# Patient Record
Sex: Male | Born: 1965 | Race: Black or African American | Hispanic: No | Smoking: Former smoker
Health system: Southern US, Community
[De-identification: ages and names within clinical notes are randomized; demographics above are authoritative.]

## PROBLEM LIST (undated history)

## (undated) DIAGNOSIS — S82891A Other fracture of right lower leg, initial encounter for closed fracture: Secondary | ICD-10-CM

---

## 2014-01-23 DIAGNOSIS — S82891A Other fracture of right lower leg, initial encounter for closed fracture: Secondary | ICD-10-CM

## 2014-01-23 HISTORY — DX: Other fracture of right lower leg, initial encounter for closed fracture: S82.891A

## 2015-10-26 ENCOUNTER — Emergency Department (HOSPITAL_COMMUNITY): Payer: 59

## 2015-10-26 ENCOUNTER — Inpatient Hospital Stay (HOSPITAL_COMMUNITY)
Admission: EM | Admit: 2015-10-26 | Discharge: 2015-10-28 | DRG: 563 | Disposition: A | Discharge status: Home / Self Care | Payer: 59 | Attending: General Surgery | Admitting: General Surgery

## 2015-10-26 ENCOUNTER — Encounter (HOSPITAL_COMMUNITY): Payer: Self-pay

## 2015-10-26 DIAGNOSIS — R0681 Apnea, not elsewhere classified: Secondary | ICD-10-CM | POA: Diagnosis present

## 2015-10-26 DIAGNOSIS — F10129 Alcohol abuse with intoxication, unspecified: Secondary | ICD-10-CM | POA: Diagnosis present

## 2015-10-26 DIAGNOSIS — M25571 Pain in right ankle and joints of right foot: Secondary | ICD-10-CM | POA: Diagnosis present

## 2015-10-26 DIAGNOSIS — S82841A Displaced bimalleolar fracture of right lower leg, initial encounter for closed fracture: Secondary | ICD-10-CM | POA: Diagnosis present

## 2015-10-26 DIAGNOSIS — R402432 Glasgow coma scale score 3-8, at arrival to emergency department: Secondary | ICD-10-CM | POA: Diagnosis present

## 2015-10-26 DIAGNOSIS — Y908 Blood alcohol level of 240 mg/100 ml or more: Secondary | ICD-10-CM | POA: Diagnosis present

## 2015-10-26 DIAGNOSIS — R4182 Altered mental status, unspecified: Secondary | ICD-10-CM

## 2015-10-26 DIAGNOSIS — Q899 Congenital malformation, unspecified: Secondary | ICD-10-CM

## 2015-10-26 DIAGNOSIS — S2242XA Multiple fractures of ribs, left side, initial encounter for closed fracture: Secondary | ICD-10-CM | POA: Diagnosis present

## 2015-10-26 DIAGNOSIS — F10929 Alcohol use, unspecified with intoxication, unspecified: Secondary | ICD-10-CM

## 2015-10-26 DIAGNOSIS — Y92410 Unspecified street and highway as the place of occurrence of the external cause: Secondary | ICD-10-CM | POA: Diagnosis not present

## 2015-10-26 DIAGNOSIS — S82891A Other fracture of right lower leg, initial encounter for closed fracture: Secondary | ICD-10-CM

## 2015-10-26 HISTORY — DX: Other fracture of right lower leg, initial encounter for closed fracture: S82.891A

## 2015-10-26 LAB — I-STAT CHEM 8, ED
BUN: 12 mg/dL (ref 6–20)
CALCIUM ION: 1.07 mmol/L — AB (ref 1.15–1.40)
CHLORIDE: 108 mmol/L (ref 101–111)
CREATININE: 1.5 mg/dL — AB (ref 0.61–1.24)
GLUCOSE: 97 mg/dL (ref 65–99)
HCT: 45 % (ref 39.0–52.0)
Hemoglobin: 15.3 g/dL (ref 13.0–17.0)
POTASSIUM: 3.5 mmol/L (ref 3.5–5.1)
Sodium: 144 mmol/L (ref 135–145)
TCO2: 22 mmol/L (ref 0–100)

## 2015-10-26 LAB — PREPARE FRESH FROZEN PLASMA
UNIT DIVISION: 0
Unit division: 0

## 2015-10-26 LAB — COMPREHENSIVE METABOLIC PANEL
ALK PHOS: 48 U/L (ref 38–126)
ALT: 18 U/L (ref 17–63)
AST: 26 U/L (ref 15–41)
Albumin: 3.8 g/dL (ref 3.5–5.0)
Anion gap: 10 (ref 5–15)
BILIRUBIN TOTAL: 0.5 mg/dL (ref 0.3–1.2)
BUN: 10 mg/dL (ref 6–20)
CHLORIDE: 109 mmol/L (ref 101–111)
CO2: 20 mmol/L — AB (ref 22–32)
CREATININE: 1.09 mg/dL (ref 0.61–1.24)
Calcium: 8.7 mg/dL — ABNORMAL LOW (ref 8.9–10.3)
GFR, EST AFRICAN AMERICAN: 46 mL/min — AB (ref >60)
GFR, EST NON AFRICAN AMERICAN: 40 mL/min — AB (ref >60)
GLUCOSE: 99 mg/dL (ref 65–99)
Potassium: 3.5 mmol/L (ref 3.5–5.1)
SODIUM: 139 mmol/L (ref 135–145)
Total Protein: 7.7 g/dL (ref 6.5–8.1)

## 2015-10-26 LAB — PROTIME-INR
INR: 1.23
Prothrombin Time: 15.5 seconds — ABNORMAL HIGH (ref 11.4–15.2)

## 2015-10-26 LAB — CBC
HCT: 43.1 % (ref 39.0–52.0)
Hemoglobin: 14.4 g/dL (ref 13.0–17.0)
MCH: 29.3 pg (ref 26.0–34.0)
MCHC: 33.4 g/dL (ref 30.0–36.0)
MCV: 87.6 fL (ref 78.0–100.0)
PLATELETS: 332 10*3/uL (ref 150–400)
RBC: 4.92 MIL/uL (ref 4.22–5.81)
RDW: 14.2 % (ref 11.5–15.5)
WBC: 10.9 10*3/uL — ABNORMAL HIGH (ref 4.0–10.5)

## 2015-10-26 LAB — I-STAT CG4 LACTIC ACID, ED: Lactic Acid, Venous: 1.88 mmol/L (ref 0.5–1.9)

## 2015-10-26 LAB — I-STAT TROPONIN, ED: Troponin i, poc: 0.01 ng/mL (ref 0.00–0.08)

## 2015-10-26 LAB — CDS SEROLOGY

## 2015-10-26 LAB — ETHANOL: Alcohol, Ethyl (B): 298 mg/dL — ABNORMAL HIGH (ref <5)

## 2015-10-26 MED ORDER — DEXTROSE-NACL 5-0.9 % IV SOLN
INTRAVENOUS | Status: DC
  Administered 2015-10-27 (×2): via INTRAVENOUS

## 2015-10-26 MED ORDER — NALOXONE HCL 2 MG/2ML IJ SOSY
PREFILLED_SYRINGE | INTRAMUSCULAR | Status: AC
  Filled 2015-10-26: qty 2

## 2015-10-26 MED ORDER — IOPAMIDOL (ISOVUE-370) INJECTION 76%
100.0000 mL | Freq: Once | INTRAVENOUS | Status: AC | PRN
  Administered 2015-10-26: 100 mL via INTRAVENOUS

## 2015-10-26 MED ORDER — LORAZEPAM 2 MG/ML IJ SOLN: 1.0000 mg | INTRAMUSCULAR | Status: DC | PRN

## 2015-10-26 MED ORDER — HYDROMORPHONE HCL 1 MG/ML IJ SOLN
1.0000 mg | INTRAMUSCULAR | Status: DC | PRN
  Filled 2015-10-26: qty 1

## 2015-10-26 MED ORDER — ONDANSETRON HCL 4 MG/2ML IJ SOLN: 4.0000 mg | Freq: Four times a day (QID) | INTRAMUSCULAR | Status: DC | PRN

## 2015-10-26 MED ORDER — ONDANSETRON HCL 4 MG PO TABS: 4.0000 mg | ORAL_TABLET | Freq: Four times a day (QID) | ORAL | Status: DC | PRN

## 2015-10-26 MED ORDER — ENOXAPARIN SODIUM 40 MG/0.4ML ~~LOC~~ SOLN
40.0000 mg | Freq: Every day | SUBCUTANEOUS | Status: DC
  Administered 2015-10-27 – 2015-10-28 (×2): 40 mg via SUBCUTANEOUS
  Filled 2015-10-26 (×2): qty 0.4
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
## 2015-10-26 NOTE — ED Notes (Addendum)
Pt became responsive, moving extremities, speaking

## 2015-10-26 NOTE — Progress Notes (Signed)
Orthopedic Tech Progress Note Patient Details:  Estella Huskewland J Doe 01/23/1873 [id number] Trauma Level 1, Ortho Visit Patient ID: Estella Huskewland J Doe, male   DOB: 01/23/1873, [age] y.o.   MRN: [id number]   Clois Dupesvery S Travaughn Vue 10/26/2015, 10:43 PM

## 2015-10-26 NOTE — ED Notes (Signed)
Trauma doc Cornett arrived
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
## 2015-10-26 NOTE — Progress Notes (Signed)
IV extravasated in left AC - 20g
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
## 2015-10-26 NOTE — Progress Notes (Signed)
Orthopedic Tech Progress Note Patient Details:  Berdine AddisonKenric Terrence Szafranski [birth year] [id number] Assisted Dr. Margarita Ranaimothy Murphy with application of posterior short leg splint and stirrup splint. Ortho Devices Type of Ortho Device: Ace wrap, Post (short leg) splint, Stirrup splint Ortho Device/Splint Location: Rt Ankle  Ortho Device/Splint Interventions: Application, Ordered   Clois Dupesvery S Deisy Ozbun 10/26/2015, 11:30 PM

## 2015-10-27 ENCOUNTER — Encounter (HOSPITAL_COMMUNITY): Payer: Self-pay | Admitting: Nurse Practitioner

## 2015-10-27 LAB — TYPE AND SCREEN
ABO/RH(D): O POS
Antibody Screen: NEGATIVE
UNIT DIVISION: 0
UNIT DIVISION: 0

## 2015-10-27 LAB — CBC
HCT: 42.7 % (ref 39.0–52.0)
Hemoglobin: 14.4 g/dL (ref 13.0–17.0)
MCH: 29.5 pg (ref 26.0–34.0)
MCHC: 33.7 g/dL (ref 30.0–36.0)
MCV: 87.5 fL (ref 78.0–100.0)
PLATELETS: 308 10*3/uL (ref 150–400)
RBC: 4.88 MIL/uL (ref 4.22–5.81)
RDW: 14.8 % (ref 11.5–15.5)
WBC: 10.8 10*3/uL — AB (ref 4.0–10.5)

## 2015-10-27 LAB — BASIC METABOLIC PANEL
ANION GAP: 11 (ref 5–15)
BUN: 9 mg/dL (ref 6–20)
CALCIUM: 8.5 mg/dL — AB (ref 8.9–10.3)
CO2: 20 mmol/L — ABNORMAL LOW (ref 22–32)
Chloride: 111 mmol/L (ref 101–111)
Creatinine, Ser: 1.13 mg/dL (ref 0.61–1.24)
GLUCOSE: 106 mg/dL — AB (ref 65–99)
Potassium: 4.3 mmol/L (ref 3.5–5.1)
SODIUM: 142 mmol/L (ref 135–145)

## 2015-10-27 LAB — URINALYSIS, ROUTINE W REFLEX MICROSCOPIC
BILIRUBIN URINE: NEGATIVE
Glucose, UA: NEGATIVE mg/dL
KETONES UR: NEGATIVE mg/dL
Leukocytes, UA: NEGATIVE
NITRITE: NEGATIVE
PH: 5 (ref 5.0–8.0)
Protein, ur: NEGATIVE mg/dL
Specific Gravity, Urine: >1.046 — ABNORMAL HIGH (ref 1.005–1.030)

## 2015-10-27 LAB — URINE MICROSCOPIC-ADD ON

## 2015-10-27 LAB — BLOOD PRODUCT ORDER (VERBAL) VERIFICATION

## 2015-10-27 LAB — ABO/RH: ABO/RH(D): O POS

## 2015-10-27 LAB — MRSA PCR SCREENING: MRSA BY PCR: NEGATIVE

## 2015-10-27 MED ORDER — OXYCODONE HCL 5 MG PO TABS
5.0000 mg | ORAL_TABLET | ORAL | Status: DC | PRN
  Administered 2015-10-27 – 2015-10-28 (×3): 10 mg via ORAL
  Filled 2015-10-27 (×3): qty 2

## 2015-10-27 MED ORDER — ACETAMINOPHEN 325 MG PO TABS
650.0000 mg | ORAL_TABLET | Freq: Four times a day (QID) | ORAL | Status: DC | PRN
  Administered 2015-10-27: 650 mg via ORAL
  Filled 2015-10-27: qty 2
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
## 2015-10-27 NOTE — ED Notes (Signed)
R ankle reduced, pt did not require sedation and slept through procedure
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
## 2015-10-28 DIAGNOSIS — S82891A Other fracture of right lower leg, initial encounter for closed fracture: Secondary | ICD-10-CM | POA: Diagnosis present

## 2015-10-28 DIAGNOSIS — F10129 Alcohol abuse with intoxication, unspecified: Secondary | ICD-10-CM | POA: Diagnosis present

## 2015-10-28 DIAGNOSIS — S2242XA Multiple fractures of ribs, left side, initial encounter for closed fracture: Secondary | ICD-10-CM | POA: Diagnosis present

## 2015-10-28 MED ORDER — OXYCODONE HCL 5 MG PO TABS
5.0000 mg | ORAL_TABLET | ORAL | 0 refills | Status: DC | PRN
Start: 2015-10-28 — End: 2015-11-09
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
## 2015-10-28 NOTE — Plan of Care (Signed)
Problem: Safety: Goal: Ability to remain free from injury will improve Outcome: Progressing Fall precautions and preventions maintained  Problem: Pain Managment: Goal: General experience of comfort will improve Outcome: Progressing Medicated once for pain with full relief.  Problem: Tissue Perfusion: Goal: Risk factors for ineffective tissue perfusion will decrease Outcome: Progressing Denies S/S of DVT  Problem: Bowel/Gastric: Goal: Will not experience complications related to bowel motility Outcome: Progressing No bowel issues noted
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
## 2015-10-28 NOTE — Discharge Instructions (Signed)
Keep weight off right leg.  Keep splint on and dry.  No driving while taking oxycodone.
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
## 2015-11-02 SURGERY — OPEN REDUCTION INTERNAL FIXATION (ORIF) ANKLE FRACTURE
Anesthesia: Choice | Laterality: Right
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
## 2015-11-05 ENCOUNTER — Encounter (HOSPITAL_BASED_OUTPATIENT_CLINIC_OR_DEPARTMENT_OTHER): Payer: Self-pay | Admitting: *Deleted

## 2015-11-09 ENCOUNTER — Encounter (HOSPITAL_BASED_OUTPATIENT_CLINIC_OR_DEPARTMENT_OTHER)
Admission: RE | Disposition: A | Discharge status: Home / Self Care | Payer: Self-pay | Source: Ambulatory Visit | Attending: Orthopedic Surgery

## 2015-11-09 ENCOUNTER — Ambulatory Visit (HOSPITAL_BASED_OUTPATIENT_CLINIC_OR_DEPARTMENT_OTHER)
Admission: RE | Admit: 2015-11-09 | Discharge: 2015-11-09 | Disposition: A | Discharge status: Home / Self Care | Payer: 59 | Source: Ambulatory Visit | Attending: Orthopedic Surgery | Admitting: Orthopedic Surgery

## 2015-11-09 ENCOUNTER — Ambulatory Visit (HOSPITAL_BASED_OUTPATIENT_CLINIC_OR_DEPARTMENT_OTHER): Payer: 59 | Admitting: Anesthesiology

## 2015-11-09 ENCOUNTER — Encounter (HOSPITAL_BASED_OUTPATIENT_CLINIC_OR_DEPARTMENT_OTHER): Payer: Self-pay | Admitting: *Deleted

## 2015-11-09 DIAGNOSIS — Z87891 Personal history of nicotine dependence: Secondary | ICD-10-CM | POA: Diagnosis not present

## 2015-11-09 DIAGNOSIS — S82891A Other fracture of right lower leg, initial encounter for closed fracture: Secondary | ICD-10-CM | POA: Diagnosis present

## 2015-11-09 DIAGNOSIS — Z6835 Body mass index (BMI) 35.0-35.9, adult: Secondary | ICD-10-CM | POA: Diagnosis not present

## 2015-11-09 DIAGNOSIS — S82851A Displaced trimalleolar fracture of right lower leg, initial encounter for closed fracture: Secondary | ICD-10-CM | POA: Insufficient documentation

## 2015-11-09 DIAGNOSIS — S2242XA Multiple fractures of ribs, left side, initial encounter for closed fracture: Secondary | ICD-10-CM | POA: Diagnosis present

## 2015-11-09 HISTORY — PX: ORIF ANKLE FRACTURE: SHX5408

## 2015-11-09 SURGERY — OPEN REDUCTION INTERNAL FIXATION (ORIF) ANKLE FRACTURE
Anesthesia: Regional | Site: Ankle | Laterality: Right

## 2015-11-09 MED ORDER — METHOCARBAMOL 500 MG PO TABS
500.0000 mg | ORAL_TABLET | Freq: Four times a day (QID) | ORAL | 0 refills | Status: AC | PRN
Start: 2015-11-09 — End: ?

## 2015-11-09 MED ORDER — GLYCOPYRROLATE 0.2 MG/ML IJ SOLN: 0.2000 mg | Freq: Once | INTRAMUSCULAR | Status: DC | PRN

## 2015-11-09 MED ORDER — OXYCODONE HCL 5 MG/5ML PO SOLN: 5.0000 mg | Freq: Once | ORAL | Status: DC | PRN

## 2015-11-09 MED ORDER — FENTANYL CITRATE (PF) 100 MCG/2ML IJ SOLN
50.0000 ug | INTRAMUSCULAR | Status: DC | PRN
  Administered 2015-11-09: 100 ug via INTRAVENOUS
  Administered 2015-11-09: 50 ug via INTRAVENOUS

## 2015-11-09 MED ORDER — MEPERIDINE HCL 25 MG/ML IJ SOLN: 6.2500 mg | INTRAMUSCULAR | Status: DC | PRN

## 2015-11-09 MED ORDER — FENTANYL CITRATE (PF) 100 MCG/2ML IJ SOLN
INTRAMUSCULAR | Status: AC
  Filled 2015-11-09: qty 2

## 2015-11-09 MED ORDER — ROPIVACAINE HCL 5 MG/ML IJ SOLN
INTRAMUSCULAR | Status: DC | PRN
  Administered 2015-11-09 (×2): 10 mL via PERINEURAL

## 2015-11-09 MED ORDER — ACETAMINOPHEN 500 MG PO TABS
ORAL_TABLET | ORAL | Status: AC
  Filled 2015-11-09: qty 2

## 2015-11-09 MED ORDER — ONDANSETRON HCL 4 MG/2ML IJ SOLN
INTRAMUSCULAR | Status: AC
  Filled 2015-11-09: qty 2

## 2015-11-09 MED ORDER — ONDANSETRON HCL 4 MG PO TABS: 4.0000 mg | ORAL_TABLET | Freq: Three times a day (TID) | ORAL | 0 refills | Status: AC | PRN

## 2015-11-09 MED ORDER — LIDOCAINE 2% (20 MG/ML) 5 ML SYRINGE
INTRAMUSCULAR | Status: DC | PRN
  Administered 2015-11-09: 100 mg via INTRAVENOUS

## 2015-11-09 MED ORDER — LIDOCAINE 2% (20 MG/ML) 5 ML SYRINGE
INTRAMUSCULAR | Status: AC
  Filled 2015-11-09: qty 5

## 2015-11-09 MED ORDER — CEFAZOLIN SODIUM-DEXTROSE 2-4 GM/100ML-% IV SOLN
INTRAVENOUS | Status: AC
  Filled 2015-11-09: qty 100

## 2015-11-09 MED ORDER — DEXAMETHASONE SODIUM PHOSPHATE 10 MG/ML IJ SOLN
INTRAMUSCULAR | Status: AC
  Filled 2015-11-09: qty 1

## 2015-11-09 MED ORDER — 0.9 % SODIUM CHLORIDE (POUR BTL) OPTIME
TOPICAL | Status: DC | PRN
  Administered 2015-11-09: 200 mL

## 2015-11-09 MED ORDER — HYDROMORPHONE HCL 1 MG/ML IJ SOLN: 0.2500 mg | INTRAMUSCULAR | Status: DC | PRN

## 2015-11-09 MED ORDER — BUPIVACAINE-EPINEPHRINE (PF) 0.5% -1:200000 IJ SOLN
INTRAMUSCULAR | Status: DC | PRN
  Administered 2015-11-09 (×2): 10 mL via PERINEURAL

## 2015-11-09 MED ORDER — ONDANSETRON HCL 4 MG/2ML IJ SOLN: 4.0000 mg | Freq: Once | INTRAMUSCULAR | Status: DC | PRN

## 2015-11-09 MED ORDER — LACTATED RINGERS IV SOLN
INTRAVENOUS | Status: DC
  Administered 2015-11-09 (×3): via INTRAVENOUS

## 2015-11-09 MED ORDER — MIDAZOLAM HCL 2 MG/2ML IJ SOLN
INTRAMUSCULAR | Status: AC
  Filled 2015-11-09: qty 2

## 2015-11-09 MED ORDER — FENTANYL CITRATE (PF) 100 MCG/2ML IJ SOLN
INTRAMUSCULAR | Status: DC | PRN
  Administered 2015-11-09 (×4): 25 ug via INTRAVENOUS

## 2015-11-09 MED ORDER — DEXAMETHASONE SODIUM PHOSPHATE 10 MG/ML IJ SOLN
INTRAMUSCULAR | Status: DC | PRN
  Administered 2015-11-09: 10 mg via INTRAVENOUS

## 2015-11-09 MED ORDER — OXYCODONE HCL 5 MG PO TABS: 5.0000 mg | ORAL_TABLET | Freq: Once | ORAL | Status: DC | PRN

## 2015-11-09 MED ORDER — LACTATED RINGERS IV SOLN: INTRAVENOUS | Status: DC

## 2015-11-09 MED ORDER — ACETAMINOPHEN 500 MG PO TABS
1000.0000 mg | ORAL_TABLET | Freq: Once | ORAL | Status: AC
  Administered 2015-11-09: 1000 mg via ORAL

## 2015-11-09 MED ORDER — CHLORHEXIDINE GLUCONATE 4 % EX LIQD: 60.0000 mL | Freq: Once | CUTANEOUS | Status: DC

## 2015-11-09 MED ORDER — ASPIRIN EC 81 MG PO TBEC: 81.0000 mg | DELAYED_RELEASE_TABLET | Freq: Every day | ORAL | 0 refills | Status: AC

## 2015-11-09 MED ORDER — ONDANSETRON HCL 4 MG/2ML IJ SOLN
INTRAMUSCULAR | Status: DC | PRN
  Administered 2015-11-09: 4 mg via INTRAVENOUS

## 2015-11-09 MED ORDER — MIDAZOLAM HCL 2 MG/2ML IJ SOLN
1.0000 mg | INTRAMUSCULAR | Status: DC | PRN
  Administered 2015-11-09: 1 mg via INTRAVENOUS
  Administered 2015-11-09: 2 mg via INTRAVENOUS

## 2015-11-09 MED ORDER — OXYCODONE-ACETAMINOPHEN 5-325 MG PO TABS: 1.0000 | ORAL_TABLET | ORAL | 0 refills | Status: AC | PRN

## 2015-11-09 MED ORDER — CEFAZOLIN SODIUM-DEXTROSE 2-4 GM/100ML-% IV SOLN
2.0000 g | INTRAVENOUS | Status: AC
  Administered 2015-11-09: 2 g via INTRAVENOUS

## 2015-11-09 MED ORDER — DOCUSATE SODIUM 100 MG PO CAPS: 100.0000 mg | ORAL_CAPSULE | Freq: Two times a day (BID) | ORAL | 0 refills | Status: AC

## 2015-11-09 MED ORDER — PROPOFOL 10 MG/ML IV BOLUS
INTRAVENOUS | Status: DC | PRN
  Administered 2015-11-09: 300 mg via INTRAVENOUS

## 2015-11-09 MED ORDER — SCOPOLAMINE 1 MG/3DAYS TD PT72: 1.0000 | MEDICATED_PATCH | Freq: Once | TRANSDERMAL | Status: DC | PRN

## 2015-11-09 SURGICAL SUPPLY — 74 items
BANDAGE ACE 4X5 VEL STRL LF (GAUZE/BANDAGES/DRESSINGS) ×9 IMPLANT
BANDAGE ACE 6X5 VEL STRL LF (GAUZE/BANDAGES/DRESSINGS) ×6 IMPLANT
BANDAGE ESMARK 6X9 LF (GAUZE/BANDAGES/DRESSINGS) ×2 IMPLANT
BIT DRILL CANN 2.7 (BIT) ×1
BIT DRILL CANN 2.7MM (BIT) ×1
BIT DRILL SRG 2.7XCANN AO CPLG (BIT) ×1 IMPLANT
BIT DRL SRG 2.7XCANN AO CPLNG (BIT) ×1
BLADE SURG 15 STRL LF DISP TIS (BLADE) ×2 IMPLANT
BLADE SURG 15 STRL SS (BLADE) ×4
BNDG COHESIVE 4X5 TAN STRL (GAUZE/BANDAGES/DRESSINGS) ×6 IMPLANT
BNDG ESMARK 6X9 LF (GAUZE/BANDAGES/DRESSINGS) ×6
CHLORAPREP W/TINT 26ML (MISCELLANEOUS) ×6 IMPLANT
CLOSURE STERI-STRIP 1/2X4 (GAUZE/BANDAGES/DRESSINGS)
CLSR STERI-STRIP ANTIMIC 1/2X4 (GAUZE/BANDAGES/DRESSINGS) IMPLANT
COVER BACK TABLE 60X90IN (DRAPES) ×6 IMPLANT
CUFF TOURNIQUET SINGLE 24IN (TOURNIQUET CUFF) IMPLANT
CUFF TOURNIQUET SINGLE 34IN LL (TOURNIQUET CUFF) ×3 IMPLANT
DECANTER SPIKE VIAL GLASS SM (MISCELLANEOUS) IMPLANT
DRAPE EXTREMITY T 121X128X90 (DRAPE) ×6 IMPLANT
DRAPE IMP U-DRAPE 54X76 (DRAPES) ×6 IMPLANT
DRAPE OEC MINIVIEW 54X84 (DRAPES) ×6 IMPLANT
DRAPE U-SHAPE 47X51 STRL (DRAPES) ×3 IMPLANT
DRILL 2.6X122MM WL AO SHAFT (BIT) ×3 IMPLANT
DRSG EMULSION OIL 3X3 NADH (GAUZE/BANDAGES/DRESSINGS) ×3 IMPLANT
DRSG PAD ABDOMINAL 8X10 ST (GAUZE/BANDAGES/DRESSINGS) ×3 IMPLANT
ELECT REM PT RETURN 9FT ADLT (ELECTROSURGICAL) ×3
ELECTRODE REM PT RTRN 9FT ADLT (ELECTROSURGICAL) ×1 IMPLANT
GAUZE SPONGE 4X4 12PLY STRL (GAUZE/BANDAGES/DRESSINGS) ×6 IMPLANT
GLOVE BIO SURGEON STRL SZ7.5 (GLOVE) ×9 IMPLANT
GLOVE BIOGEL PI IND STRL 8 (GLOVE) ×2 IMPLANT
GLOVE BIOGEL PI INDICATOR 8 (GLOVE) ×4
GOWN STRL REUS W/ TWL LRG LVL3 (GOWN DISPOSABLE) ×3 IMPLANT
GOWN STRL REUS W/ TWL XL LVL3 (GOWN DISPOSABLE) ×2 IMPLANT
GOWN STRL REUS W/TWL LRG LVL3 (GOWN DISPOSABLE) ×6
GOWN STRL REUS W/TWL XL LVL3 (GOWN DISPOSABLE) ×4
K-WIRE ORTHOPEDIC 1.4X150L (WIRE) ×6
KWIRE ORTHOPEDIC 1.4X150L (WIRE) ×2 IMPLANT
NEEDLE HYPO 22GX1.5 SAFETY (NEEDLE) IMPLANT
NS IRRIG 1000ML POUR BTL (IV SOLUTION) ×6 IMPLANT
PACK BASIN DAY SURGERY FS (CUSTOM PROCEDURE TRAY) ×6 IMPLANT
PAD CAST 4YDX4 CTTN HI CHSV (CAST SUPPLIES) ×2 IMPLANT
PADDING CAST ABS 4INX4YD NS (CAST SUPPLIES) ×4
PADDING CAST ABS COTTON 4X4 ST (CAST SUPPLIES) ×2 IMPLANT
PADDING CAST COTTON 4X4 STRL (CAST SUPPLIES) ×4
PADDING CAST COTTON 6X4 STRL (CAST SUPPLIES) ×6 IMPLANT
PENCIL BUTTON HOLSTER BLD 10FT (ELECTRODE) ×6 IMPLANT
PLATE FIBULA 4H (Plate) ×3 IMPLANT
SCREW BONE 18 (Screw) ×9 IMPLANT
SCREW CANNULATED TI 4.0X44 (Screw) ×6 IMPLANT
SCREW LOCK 3.5X14 (Screw) ×6 IMPLANT
SCREW LOCKING 3.5X12 (Screw) ×3 IMPLANT
SCREW LOCKING 3.5X16MM (Screw) ×3 IMPLANT
SCREW NONLOCK 65MM (Screw) ×3 IMPLANT
SLEEVE SCD COMPRESS KNEE MED (MISCELLANEOUS) ×3 IMPLANT
SPLINT FAST PLASTER 5X30 (CAST SUPPLIES) ×40
SPLINT PLASTER CAST FAST 5X30 (CAST SUPPLIES) ×20 IMPLANT
SPONGE LAP 4X18 X RAY DECT (DISPOSABLE) ×6 IMPLANT
SUCTION FRAZIER HANDLE 10FR (MISCELLANEOUS) ×4
SUCTION TUBE FRAZIER 10FR DISP (MISCELLANEOUS) ×2 IMPLANT
SUT ETHILON 3 0 PS 1 (SUTURE) ×12 IMPLANT
SUT MNCRL AB 4-0 PS2 18 (SUTURE) IMPLANT
SUT MON AB 2-0 CT1 36 (SUTURE) IMPLANT
SUT MON AB 3-0 SH 27 (SUTURE)
SUT MON AB 3-0 SH27 (SUTURE) IMPLANT
SUT VIC AB 0 SH 27 (SUTURE) ×6 IMPLANT
SUT VIC AB 2-0 SH 27 (SUTURE)
SUT VIC AB 2-0 SH 27XBRD (SUTURE) IMPLANT
SYR BULB 3OZ (MISCELLANEOUS) ×6 IMPLANT
SYR CONTROL 10ML LL (SYRINGE) IMPLANT
TOWEL OR 17X24 6PK STRL BLUE (TOWEL DISPOSABLE) ×6 IMPLANT
TOWEL OR NON WOVEN STRL DISP B (DISPOSABLE) ×6 IMPLANT
TUBE CONNECTING 20'X1/4 (TUBING) ×2
TUBE CONNECTING 20X1/4 (TUBING) ×4 IMPLANT
UNDERPAD 30X30 (UNDERPADS AND DIAPERS) ×3 IMPLANT

## 2015-11-09 NOTE — Anesthesia Preprocedure Evaluation (Signed)
Anesthesia Evaluation  Patient identified by MRN, date of birth, ID band Patient awake    Reviewed: Allergy & Precautions, NPO status , Patient's Chart, lab work & pertinent test results  Airway Mallampati: I  TM Distance: >3 FB Neck ROM: Full    Dental  (+) Upper Dentures, Teeth Intact, Dental Advisory Given   Pulmonary former smoker,    breath sounds clear to auscultation       Cardiovascular  Rhythm:Regular Rate:Normal     Neuro/Psych    GI/Hepatic   Endo/Other  Morbid obesity  Renal/GU      Musculoskeletal   Abdominal   Peds  Hematology   Anesthesia Other Findings   Reproductive/Obstetrics                             Anesthesia Physical Anesthesia Plan  ASA: II  Anesthesia Plan: General   Post-op Pain Management: GA combined w/ Regional for post-op pain   Induction: Intravenous  Airway Management Planned: LMA  Additional Equipment:   Intra-op Plan:   Post-operative Plan: Extubation in OR  Informed Consent: I have reviewed the patients History and Physical, chart, labs and discussed the procedure including the risks, benefits and alternatives for the proposed anesthesia with the patient or authorized representative who has indicated his/her understanding and acceptance.   Dental advisory given  Plan Discussed with: CRNA, Anesthesiologist and Surgeon  Anesthesia Plan Comments:         Anesthesia Quick Evaluation

## 2015-11-09 NOTE — Transfer of Care (Signed)
Immediate Anesthesia Transfer of Care Note  Patient: Engineer, [name]  Procedure(s) Performed: Procedure(s) with comments: OPEN REDUCTION INTERNAL FIXATION (ORIF) RIGHT ANKLE FRACTURE (Right) - OPEN REDUCTION INTERNAL FIXATION (ORIF) RIGHT ANKLE FRACTURE  Patient Location: PACU  Anesthesia Type:GA combined with regional for post-op pain  Level of Consciousness: awake, alert , oriented and patient cooperative  Airway & Oxygen Therapy: Patient Spontanous Breathing and Patient connected to face mask oxygen  Post-op Assessment: Report given to RN, Post -op Vital signs reviewed and stable and Patient moving all extremities  Post vital signs: Reviewed and stable  Last Vitals:  Vitals:   11/09/15 1015 11/09/15 1030  BP: 128/88   Pulse: 66 67  Resp: 18 19  Temp:      Last Pain:  Vitals:   11/09/15 0820  TempSrc: Oral  PainSc: 5          Complications: No apparent anesthesia complications

## 2015-11-09 NOTE — Progress Notes (Signed)
Assisted Dr. Crews with right, ultrasound guided, popliteal/saphenous block. Side rails up, monitors on throughout procedure. See vital signs in flow sheet. Tolerated Procedure well. 

## 2015-11-09 NOTE — Interval H&P Note (Signed)
History and Physical Interval Note:  11/09/2015 10:45 AM  Zachary Moore  has presented today for surgery, with the diagnosis of right ankle fracture  The various methods of treatment have been discussed with the patient and family. After consideration of risks, benefits and other options for treatment, the patient has consented to  Procedure(s): OPEN REDUCTION INTERNAL FIXATION (ORIF) RIGHT ANKLE FRACTURE (Right) as a surgical intervention .  The patient's history has been reviewed, patient examined, no change in status, stable for surgery.  I have reviewed the patient's chart and labs.  Questions were answered to the patient's satisfaction.     [name]

## 2015-11-09 NOTE — Anesthesia Procedure Notes (Addendum)
Anesthesia Regional Block:  Adductor canal block  Pre-Anesthetic Checklist: ,, timeout performed, Correct Patient, Correct Site, Correct Laterality, Correct Procedure, Correct Position, site marked, Risks and benefits discussed,  Surgical consent,  Pre-op evaluation,  At surgeon's request and post-op pain management  Laterality: Right and Lower  Prep: chloraprep       Needles:  Injection technique: Single-shot  Needle Type: Echogenic Needle     Needle Length: 9cm 9 cm Needle Gauge: 21 and 21 G    Additional Needles:  Procedures: ultrasound guided (picture in chart) Adductor canal block Narrative:  Start time: 11/09/2015 9:47 AM End time: 11/09/2015 9:51 AM Injection made incrementally with aspirations every 5 mL.  Performed by: Personally  Anesthesiologist: Becki Mccaskill       Right saphenous block image

## 2015-11-09 NOTE — Discharge Instructions (Signed)
Elevate leg as much as possible to reduce pain and swelling.  Diet: As you were doing prior to hospitalization   Shower:  You have a splint on, leave the splint in place and keep the splint dry with a plastic bag.  Dressing:  You  have a splint, then just leave the splint in place and we will change your bandages during your first follow-up appointment.    Activity:  Increase activity slowly as tolerated, but follow the weight bearing instructions below.  The rules on driving is that you can not be taking narcotics while you drive, and you must feel in control of the vehicle.    Weight Bearing:   Non weight bearing right leg  To prevent constipation: you may use a stool softener such as -  Colace (over the counter) 100 mg by mouth twice a day  Drink plenty of fluids (prune juice may be helpful) and high fiber foods Miralax (over the counter) for constipation as needed.    Itching:  If you experience itching with your medications, try taking only a single pain pill, or even half a pain pill at a time.  You may take up to 10 pain pills per day, and you can also use benadryl over the counter for itching or also to help with sleep.   Precautions:  If you experience chest pain or shortness of breath - call 911 immediately for transfer to the hospital emergency department!!  If you develop a fever greater that 101 F, purulent drainage from wound, increased redness or drainage from wound, or calf pain -- Call the office at [phone]                                                Follow- Up Appointment:  Please call for an appointment to be seen in 2 weeks [place] - [phone]   Post Anesthesia Home Care Instructions  Activity: Get plenty of rest for the remainder of the day. A responsible adult should stay with you for 24 hours following the procedure.  For the next 24 hours, DO NOT: -Drive a car -Advertising copywriterperate machinery -Drink alcoholic beverages -Take any medication unless instructed  by your physician -Make any legal decisions or sign important papers.  Meals: Start with liquid foods such as gelatin or soup. Progress to regular foods as tolerated. Avoid greasy, spicy, heavy foods. If nausea and/or vomiting occur, drink only clear liquids until the nausea and/or vomiting subsides. Call your physician if vomiting continues.  Special Instructions/Symptoms: Your throat may feel dry or sore from the anesthesia or the breathing tube placed in your throat during surgery. If this causes discomfort, gargle with warm salt water. The discomfort should disappear within 24 hours.  If you had a scopolamine patch placed behind your ear for the management of post- operative nausea and/or vomiting:  1. The medication in the patch is effective for 72 hours, after which it should be removed.  Wrap patch in a tissue and discard in the trash. Wash hands thoroughly with soap and water. 2. You may remove the patch earlier than 72 hours if you experience unpleasant side effects which may include dry mouth, dizziness or visual disturbances. 3. Avoid touching the patch. Wash your hands with soap and water after contact with the patch.   Regional Anesthesia Blocks  1. Numbness or the  inability to move the "blocked" extremity may last from 3-48 hours after placement. The length of time depends on the medication injected and your individual response to the medication. If the numbness is not going away after 48 hours, call your surgeon.  2. The extremity that is blocked will need to be protected until the numbness is gone and the  Strength has returned. Because you cannot feel it, you will need to take extra care to avoid injury. Because it may be weak, you may have difficulty moving it or using it. You may not know what position it is in without looking at it while the block is in effect.  3. For blocks in the legs and feet, returning to weight bearing and walking needs to be done carefully. You will  need to wait until the numbness is entirely gone and the strength has returned. You should be able to move your leg and foot normally before you try and bear weight or walk. You will need someone to be with you when you first try to ensure you do not fall and possibly risk injury.  4. Bruising and tenderness at the needle site are common side effects and will resolve in a few days.  5. Persistent numbness or new problems with movement should be communicated to the surgeon or the [facility] [phone] [facility] [phone]).

## 2015-11-09 NOTE — Op Note (Signed)
11/09/2015  12:00 PM  PATIENT:  Zachary BailiffKenric Terrence Reek    PRE-OPERATIVE DIAGNOSIS:  right ankle fracture  POST-OPERATIVE DIAGNOSIS:  Same  PROCEDURE:  OPEN REDUCTION INTERNAL FIXATION (ORIF) RIGHT ANKLE FRACTURE  SURGEON:  Dariush Mcnellis, Jewel BaizeIMOTHY D, MD  ASSISTANT: Aquilla HackerHenry Martensen, PA-C, he was present and scrubbed throughout the case, critical for completion in a timely fashion, and for retraction, instrumentation, and closure.   ANESTHESIA:   gen  PREOPERATIVE INDICATIONS:  Berdine AddisonKenric Terrence Tatar is a  50 y.o. male with a diagnosis of right ankle fracture who failed conservative measures and elected for surgical management.    The risks benefits and alternatives were discussed with the patient preoperatively including but not limited to the risks of infection, bleeding, nerve injury, cardiopulmonary complications, the need for revision surgery, among others, and the patient was willing to proceed.  OPERATIVE IMPLANTS:     OPERATIVE FINDINGS: Unstable ankle fracture. Stable syndesmosis post op  BLOOD LOSS: min  COMPLICATIONS: none  TOURNIQUET TIME: 60min  OPERATIVE PROCEDURE:  Patient was identified in the preoperative holding area and site was marked by me He was transported to the operating theater and placed on the table in supine position taking care to pad all bony prominences. After a preincinduction time out anesthesia was induced. The right lower extremity was prepped and draped in normal sterile fashion and a pre-incision timeout was performed. [name] received ancef for preoperative antibiotics.   I made a lateral incision of roughly 7 cm dissection was carried down sharply to the distal fibula and then spreading dissection was used proximally to protect the superficial peroneal nerve. I sharply incised the periosteum and took care to protect the peroneal tendons. I then debrided the fracture site and performed a reduction maneuver which was held in place with a  clamp.   I then selected a 4-hole variax clover plate and placed in a bridge fashion care was taken distally so as not to penetrate the joint with the cancellus screws.  I then turned my attention medially where I created a 4 cm incision and dissected sharply down to the medial Mal fracture taking care to protect the saphenous vein. I debrided the fracture and reduced and held in place with a tenaculum. I then drilled and placed 2 partially threaded 45 mm cannulated screws one anterior and one posterior across the fracture.  I then stressed the syndesmosis and it was unstable for syndesmotic fixation I performed a reduction maneuver with a clamp and placed a quad cortical screw  I assesed the posterior mal piece and it was small enough to not require fixation as it involved less than 20% of the articular surface  The wound was then thoroughly irrigated and closed using a 0 Vicryl and absorbable Monocryl sutures. He was placed in a short leg splint.   POST OPERATIVE PLAN: Non-weightbearing. DVT prophylaxis will consist of mobilization

## 2015-11-09 NOTE — Anesthesia Procedure Notes (Addendum)
Anesthesia Regional Block:  Popliteal block  Pre-Anesthetic Checklist: ,, timeout performed, Correct Patient, Correct Site, Correct Laterality, Correct Procedure, Correct Position, site marked, Risks and benefits discussed,  Surgical consent,  Pre-op evaluation,  At surgeon's request and post-op pain management  Laterality: Right and Lower  Prep: chloraprep       Needles:  Injection technique: Single-shot  Needle Type: Echogenic Needle     Needle Length: 9cm 9 cm Needle Gauge: 21 and 21 G    Additional Needles:  Procedures: ultrasound guided (picture in chart) Popliteal block Narrative:  Start time: 11/09/2015 9:43 AM End time: 11/09/2015 9:47 AM Injection made incrementally with aspirations every 5 mL.  Performed by: Personally  Anesthesiologist: Nicolus Ose       Right popliteal block image

## 2015-11-09 NOTE — Anesthesia Postprocedure Evaluation (Signed)
Anesthesia Post Note  Patient: Engineer, [name]  Procedure(s) Performed: Procedure(s) (LRB): OPEN REDUCTION INTERNAL FIXATION (ORIF) RIGHT ANKLE FRACTURE (Right)  Patient location during evaluation: PACU Anesthesia Type: General Level of consciousness: awake and alert Pain management: pain level controlled Vital Signs Assessment: post-procedure vital signs reviewed and stable Respiratory status: spontaneous breathing, nonlabored ventilation and respiratory function stable Cardiovascular status: blood pressure returned to baseline and stable Postop Assessment: no signs of nausea or vomiting Anesthetic complications: no    Last Vitals:  Vitals:   11/09/15 1246 11/09/15 1300  BP: (!) 134/93 (!) 132/97  Pulse: 88 78  Resp: 16 17  Temp: 36.4 C     Last Pain:  Vitals:   11/09/15 1246  TempSrc:   PainSc: 0-No pain                 Meoshia Billing A

## 2015-11-09 NOTE — Anesthesia Procedure Notes (Signed)
Procedure Name: LMA Insertion Date/Time: 11/09/2015 10:59 AM Performed by: Curly ShoresRAFT, Kimbree Casanas W Pre-anesthesia Checklist: Patient identified, Emergency Drugs available, Suction available and Patient being monitored Patient Re-evaluated:Patient Re-evaluated prior to inductionOxygen Delivery Method: Circle system utilized Preoxygenation: Pre-oxygenation with 100% oxygen Intubation Type: IV induction Ventilation: Mask ventilation without difficulty LMA: LMA inserted LMA Size: 5.0 Number of attempts: 1 Airway Equipment and Method: Bite block Placement Confirmation: positive ETCO2 and breath sounds checked- equal and bilateral Tube secured with: Tape Dental Injury: Teeth and Oropharynx as per pre-operative assessment

## 2015-11-10 ENCOUNTER — Encounter (HOSPITAL_BASED_OUTPATIENT_CLINIC_OR_DEPARTMENT_OTHER): Payer: Self-pay | Admitting: Orthopedic Surgery
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
## 2020-05-18 ENCOUNTER — Ambulatory Visit: Payer: Self-pay | Admitting: Nurse Practitioner
